# Patient Record
Sex: Female | Born: 1990 | Race: Black or African American | Hispanic: No | Marital: Single | Smoking: Never smoker
Health system: Southern US, Community
[De-identification: ages and names within clinical notes are randomized; demographics above are authoritative.]

## PROBLEM LIST (undated history)

## (undated) DIAGNOSIS — I10 Essential (primary) hypertension: Secondary | ICD-10-CM

## (undated) DIAGNOSIS — Z789 Other specified health status: Secondary | ICD-10-CM

## (undated) DIAGNOSIS — N83209 Unspecified ovarian cyst, unspecified side: Secondary | ICD-10-CM

## (undated) HISTORY — PX: OVARIAN CYST REMOVAL: SHX89

---

## 2013-12-07 ENCOUNTER — Encounter (HOSPITAL_COMMUNITY): Payer: Self-pay | Admitting: *Deleted

## 2013-12-07 ENCOUNTER — Emergency Department (HOSPITAL_COMMUNITY)
Admission: EM | Admit: 2013-12-07 | Discharge: 2013-12-08 | Disposition: A | Discharge status: Home / Self Care | Payer: Medicaid Other | Attending: Emergency Medicine | Admitting: Emergency Medicine

## 2013-12-07 ENCOUNTER — Emergency Department (HOSPITAL_COMMUNITY): Payer: Medicaid Other

## 2013-12-07 DIAGNOSIS — O26899 Other specified pregnancy related conditions, unspecified trimester: Secondary | ICD-10-CM

## 2013-12-07 DIAGNOSIS — S161XXA Strain of muscle, fascia and tendon at neck level, initial encounter: Secondary | ICD-10-CM

## 2013-12-07 DIAGNOSIS — O9A211 Injury, poisoning and certain other consequences of external causes complicating pregnancy, first trimester: Secondary | ICD-10-CM | POA: Diagnosis present

## 2013-12-07 DIAGNOSIS — Z3A14 14 weeks gestation of pregnancy: Secondary | ICD-10-CM | POA: Diagnosis not present

## 2013-12-07 DIAGNOSIS — Y92410 Unspecified street and highway as the place of occurrence of the external cause: Secondary | ICD-10-CM | POA: Insufficient documentation

## 2013-12-07 DIAGNOSIS — Y9389 Activity, other specified: Secondary | ICD-10-CM | POA: Diagnosis not present

## 2013-12-07 DIAGNOSIS — S3991XA Unspecified injury of abdomen, initial encounter: Secondary | ICD-10-CM | POA: Insufficient documentation

## 2013-12-07 DIAGNOSIS — R109 Unspecified abdominal pain: Secondary | ICD-10-CM

## 2013-12-07 LAB — CBC WITH DIFFERENTIAL/PLATELET
BASOS ABS: 0 10*3/uL (ref 0.0–0.1)
BASOS PCT: 0 % (ref 0–1)
EOS ABS: 0.1 10*3/uL (ref 0.0–0.7)
Eosinophils Relative: 1 % (ref 0–5)
HCT: 35.1 % — ABNORMAL LOW (ref 36.0–46.0)
HEMOGLOBIN: 11.4 g/dL — AB (ref 12.0–15.0)
Lymphocytes Relative: 25 % (ref 12–46)
Lymphs Abs: 1.7 10*3/uL (ref 0.7–4.0)
MCH: 28.1 pg (ref 26.0–34.0)
MCHC: 32.5 g/dL (ref 30.0–36.0)
MCV: 86.5 fL (ref 78.0–100.0)
MONO ABS: 0.4 10*3/uL (ref 0.1–1.0)
Monocytes Relative: 6 % (ref 3–12)
NEUTROS PCT: 68 % (ref 43–77)
Neutro Abs: 4.6 10*3/uL (ref 1.7–7.7)
Platelets: 257 10*3/uL (ref 150–400)
RBC: 4.06 MIL/uL (ref 3.87–5.11)
RDW: 13.5 % (ref 11.5–15.5)
WBC: 6.8 10*3/uL (ref 4.0–10.5)

## 2013-12-07 LAB — TYPE AND SCREEN
ABO/RH(D): O POS
Antibody Screen: NEGATIVE

## 2013-12-07 LAB — POC URINE PREG, ED: Preg Test, Ur: POSITIVE — AB
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
## 2013-12-07 NOTE — ED Notes (Signed)
Pt states that she has not noticed any more spotting when she went to the restroom while she was here.
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
## 2013-12-08 LAB — ABO/RH: ABO/RH(D): O POS
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
## 2014-01-25 ENCOUNTER — Other Ambulatory Visit (HOSPITAL_COMMUNITY): Payer: Self-pay | Admitting: Physician Assistant

## 2014-01-25 DIAGNOSIS — Z3689 Encounter for other specified antenatal screening: Secondary | ICD-10-CM

## 2014-01-25 LAB — OB RESULTS CONSOLE ABO/RH: RH Type: POSITIVE

## 2014-01-25 LAB — OB RESULTS CONSOLE HIV ANTIBODY (ROUTINE TESTING): HIV: NONREACTIVE

## 2014-01-25 LAB — OB RESULTS CONSOLE RUBELLA ANTIBODY, IGM: Rubella: NON-IMMUNE/NOT IMMUNE

## 2014-01-25 LAB — OB RESULTS CONSOLE RPR: RPR: NONREACTIVE

## 2014-01-25 LAB — OB RESULTS CONSOLE HEPATITIS B SURFACE ANTIGEN: Hepatitis B Surface Ag: NEGATIVE

## 2014-02-01 ENCOUNTER — Ambulatory Visit (HOSPITAL_COMMUNITY)
Admission: RE | Admit: 2014-02-01 | Discharge: 2014-02-01 | Disposition: A | Discharge status: Home / Self Care | Payer: Medicaid Other | Source: Ambulatory Visit | Attending: Physician Assistant | Admitting: Physician Assistant

## 2014-02-01 DIAGNOSIS — Z36 Encounter for antenatal screening of mother: Secondary | ICD-10-CM | POA: Insufficient documentation

## 2014-02-01 DIAGNOSIS — Z3A20 20 weeks gestation of pregnancy: Secondary | ICD-10-CM | POA: Insufficient documentation

## 2014-02-01 DIAGNOSIS — Z3689 Encounter for other specified antenatal screening: Secondary | ICD-10-CM

## 2014-02-02 DIAGNOSIS — Z3689 Encounter for other specified antenatal screening: Secondary | ICD-10-CM | POA: Insufficient documentation

## 2014-02-02 DIAGNOSIS — Z3A19 19 weeks gestation of pregnancy: Secondary | ICD-10-CM | POA: Insufficient documentation

## 2014-02-05 NOTE — L&D Delivery Note (Signed)
Patient is 24 y.o. G2P1001 5436w6d admitted IOL 2/2 gHTN   Delivery Note At 11:20 AM a viable female was delivered via SVD (Presentation: LOA).  APGAR: 8, 9; weight Pending.  Infant dried and placed on mother's abdomen.  Cord clamped and cut by Grandma.  Hospital cord blood sample collected.  Placenta delivered, fundal massage applied, vagina inspected for lacerations.  Placenta status:in tact .  Cord: 3 vessel with the following complications: none.  Zerita Boersarlene Lawson, CNM gloved and actively participated in the entirety of the delivery.  Anesthesia:  Epidural Episiotomy:  none Lacerations:  none Suture Repair: n/a Est. Blood Loss (mL):  150cc  Mom to postpartum.  Baby to Couplet care / Skin to Skin.  Delynn FlavinGottschalk, Ashly HuntingtonM, DO 06/06/2014, 11:32 AM  D Hart RochesterLawson CNM

## 2014-02-10 LAB — OB RESULTS CONSOLE GC/CHLAMYDIA
CHLAMYDIA, DNA PROBE: NEGATIVE
Gonorrhea: NEGATIVE

## 2014-04-07 ENCOUNTER — Encounter: Payer: Self-pay | Admitting: *Deleted

## 2014-05-29 LAB — OB RESULTS CONSOLE GBS: STREP GROUP B AG: POSITIVE

## 2014-06-05 ENCOUNTER — Encounter (HOSPITAL_COMMUNITY): Payer: Self-pay | Admitting: *Deleted

## 2014-06-05 ENCOUNTER — Inpatient Hospital Stay (HOSPITAL_COMMUNITY)
Admission: AD | Admit: 2014-06-05 | Discharge: 2014-06-08 | DRG: 775 | Disposition: A | Discharge status: Home / Self Care | Payer: Medicaid Other | Source: Ambulatory Visit | Attending: Obstetrics and Gynecology | Admitting: Obstetrics and Gynecology

## 2014-06-05 DIAGNOSIS — O99824 Streptococcus B carrier state complicating childbirth: Secondary | ICD-10-CM | POA: Diagnosis present

## 2014-06-05 DIAGNOSIS — Z3483 Encounter for supervision of other normal pregnancy, third trimester: Secondary | ICD-10-CM | POA: Diagnosis present

## 2014-06-05 DIAGNOSIS — Z3A37 37 weeks gestation of pregnancy: Secondary | ICD-10-CM | POA: Diagnosis present

## 2014-06-05 DIAGNOSIS — O133 Gestational [pregnancy-induced] hypertension without significant proteinuria, third trimester: Secondary | ICD-10-CM | POA: Diagnosis present

## 2014-06-05 DIAGNOSIS — O139 Gestational [pregnancy-induced] hypertension without significant proteinuria, unspecified trimester: Secondary | ICD-10-CM | POA: Diagnosis present

## 2014-06-05 HISTORY — DX: Other specified health status: Z78.9

## 2014-06-05 HISTORY — DX: Unspecified ovarian cyst, unspecified side: N83.209

## 2014-06-05 LAB — COMPREHENSIVE METABOLIC PANEL
ALBUMIN: 3 g/dL — AB (ref 3.5–5.2)
ALK PHOS: 111 U/L (ref 39–117)
ALT: 15 U/L (ref 0–35)
AST: 21 U/L (ref 0–37)
Anion gap: 9 (ref 5–15)
BUN: 12 mg/dL (ref 6–23)
CHLORIDE: 106 mmol/L (ref 96–112)
CO2: 23 mmol/L (ref 19–32)
CREATININE: 0.67 mg/dL (ref 0.50–1.10)
Calcium: 9.3 mg/dL (ref 8.4–10.5)
GFR calc Af Amer: >90 mL/min (ref >90)
GFR calc non Af Amer: >90 mL/min (ref >90)
Glucose, Bld: 100 mg/dL — ABNORMAL HIGH (ref 70–99)
POTASSIUM: 3.5 mmol/L (ref 3.5–5.1)
Sodium: 138 mmol/L (ref 135–145)
Total Bilirubin: 0.3 mg/dL (ref 0.3–1.2)
Total Protein: 6.7 g/dL (ref 6.0–8.3)

## 2014-06-05 LAB — CBC
HCT: 29.5 % — ABNORMAL LOW (ref 36.0–46.0)
HEMOGLOBIN: 9.4 g/dL — AB (ref 12.0–15.0)
MCH: 27.8 pg (ref 26.0–34.0)
MCHC: 31.9 g/dL (ref 30.0–36.0)
MCV: 87.3 fL (ref 78.0–100.0)
Platelets: 191 10*3/uL (ref 150–400)
RBC: 3.38 MIL/uL — ABNORMAL LOW (ref 3.87–5.11)
RDW: 14.4 % (ref 11.5–15.5)
WBC: 7.9 10*3/uL (ref 4.0–10.5)

## 2014-06-05 LAB — PROTEIN / CREATININE RATIO, URINE
Creatinine, Urine: 107 mg/dL
PROTEIN CREATININE RATIO: 0.2 mg/mg{creat} — AB (ref 0.00–0.15)
TOTAL PROTEIN, URINE: 21 mg/dL

## 2014-06-05 LAB — POCT FERN TEST: POCT FERN TEST: NEGATIVE

## 2014-06-05 MED ORDER — LACTATED RINGERS IV SOLN: 500.0000 mL | INTRAVENOUS | Status: DC | PRN

## 2014-06-05 MED ORDER — ONDANSETRON HCL 4 MG/2ML IJ SOLN: 4.0000 mg | Freq: Four times a day (QID) | INTRAMUSCULAR | Status: DC | PRN

## 2014-06-05 MED ORDER — ACETAMINOPHEN 325 MG PO TABS: 650.0000 mg | ORAL_TABLET | ORAL | Status: DC | PRN

## 2014-06-05 MED ORDER — OXYCODONE-ACETAMINOPHEN 5-325 MG PO TABS: 1.0000 | ORAL_TABLET | ORAL | Status: DC | PRN

## 2014-06-05 MED ORDER — LIDOCAINE HCL (PF) 1 % IJ SOLN
30.0000 mL | INTRAMUSCULAR | Status: DC | PRN
  Filled 2014-06-05: qty 30

## 2014-06-05 MED ORDER — TERBUTALINE SULFATE 1 MG/ML IJ SOLN: 0.2500 mg | Freq: Once | INTRAMUSCULAR | Status: AC | PRN

## 2014-06-05 MED ORDER — OXYTOCIN BOLUS FROM INFUSION
500.0000 mL | INTRAVENOUS | Status: DC
  Administered 2014-06-06: 500 mL via INTRAVENOUS

## 2014-06-05 MED ORDER — LACTATED RINGERS IV SOLN
INTRAVENOUS | Status: DC
  Administered 2014-06-06: 02:00:00 via INTRAVENOUS

## 2014-06-05 MED ORDER — CITRIC ACID-SODIUM CITRATE 334-500 MG/5ML PO SOLN: 30.0000 mL | ORAL | Status: DC | PRN

## 2014-06-05 MED ORDER — OXYCODONE-ACETAMINOPHEN 5-325 MG PO TABS: 2.0000 | ORAL_TABLET | ORAL | Status: DC | PRN

## 2014-06-05 MED ORDER — PENICILLIN G POTASSIUM 5000000 UNITS IJ SOLR
2.5000 10*6.[IU] | INTRAVENOUS | Status: DC
  Administered 2014-06-06 (×2): 2.5 10*6.[IU] via INTRAVENOUS
  Filled 2014-06-05 (×7): qty 2.5

## 2014-06-05 MED ORDER — PENICILLIN G POTASSIUM 5000000 UNITS IJ SOLR
5.0000 10*6.[IU] | Freq: Once | INTRAVENOUS | Status: AC
  Administered 2014-06-05: 5 10*6.[IU] via INTRAVENOUS
  Filled 2014-06-05: qty 5

## 2014-06-05 MED ORDER — OXYTOCIN 40 UNITS IN LACTATED RINGERS INFUSION - SIMPLE MED
62.5000 mL/h | INTRAVENOUS | Status: DC
  Administered 2014-06-06: 62.5 mL/h via INTRAVENOUS

## 2014-06-05 MED ORDER — OXYTOCIN 40 UNITS IN LACTATED RINGERS INFUSION - SIMPLE MED
1.0000 m[IU]/min | INTRAVENOUS | Status: DC
  Administered 2014-06-05: 2 m[IU]/min via INTRAVENOUS
  Administered 2014-06-06: 6 m[IU]/min via INTRAVENOUS
  Administered 2014-06-06: 4 m[IU]/min via INTRAVENOUS
  Administered 2014-06-06: 8 m[IU]/min via INTRAVENOUS
  Filled 2014-06-05: qty 1000

## 2014-06-05 NOTE — MAU Provider Note (Signed)
HPI  Note entered in error, please see H&P for further documentation.  Unable to delete this note.  ROSPhysical Exam  MAU Course  Procedures  Brandi Hatfield ROCIO 06/05/2014, 9:16 PM

## 2014-06-05 NOTE — MAU Note (Signed)
Pt reports she had some leaking around 5 pm tonight also c/o some pain in her lower back and side. Good fetal movement reported.

## 2014-06-05 NOTE — H&P (Signed)
LABOR ADMISSION HISTORY AND PHYSICAL  Brandi Hatfield is a 24 y.o. female G2P1001 with IUP at [redacted]w[redacted]d by LMP c/w 12wk sono presenting for evaluation of rupture of membranes, reports leaking since about 1700. She reports +FMs, no VB, no blurry vision, headaches or peripheral edema, and RUQ pain.  She plans on breast feeding. She request nexplanon for birth control, had signed BTL consents but has since changed her mind  Dating: By LMP c/w 12w sono --->  Estimated Date of Delivery: 06/22/14   Prenatal History/Complications:  Past Medical History: Past Medical History  Diagnosis Date  . Medical history non-contributory   . Ovarian cyst     Past Surgical History: Past Surgical History  Procedure Laterality Date  . Ovarian cyst removal      Obstetrical History: OB History    Gravida Para Term Preterm AB TAB SAB Ectopic Multiple Living   Social History: History   Social History  . Marital Status: Single    Spouse Name: N/A  . Number of Children: N/A  . Years of Education: N/A   Social History Main Topics  . Smoking status: Never Smoker   . Smokeless tobacco: Not on file  . Alcohol Use: No  . Drug Use: No  . Sexual Activity: Not on file   Other Topics Concern  . None   Social History Narrative    Family History: No family history on file.  Allergies: No Known Allergies  Prescriptions prior to admission  Medication Sig Dispense Refill Last Dose  . Prenatal Vit-Fe Fumarate-FA (PRENATAL MULTIVITAMIN) TABS tablet Take 1 tablet by mouth daily at 12 noon.   Past Week at Unknown time     Review of Systems   All systems reviewed and negative except as stated in HPI  Blood pressure 134/85, pulse 93, temperature 98.9 F (37.2 C), resp. rate 18, last menstrual period 09/21/2013. General appearance: alert, cooperative and appears stated age Lungs: clear to auscultation bilaterally Heart: regular rate and rhythm Abdomen: soft, non-tender; bowel  sounds normal Pelvic: adequate Extremities: Homans sign is negative, no sign of DVT DTR's 2+ Presentation: cephalic Fetal monitoringBaseline: 140 bpm, Variability: Good {> 6 bpm), Accelerations: Reactive and Decelerations: Absent Uterine activityirregular  SSE: negative valsalva/pooling, white vaginal discharge noted. Dilation: 2.5 Effacement (%): 50 Exam by:: Dr. Loreta Ave   Prenatal labs: ABO, Rh: O/Positive/-- (12/21 0000) Antibody: NEG (11/02 2249) Rubella:   RPR: Nonreactive (12/21 0000)  HBsAg: Negative (12/21 0000)  HIV: Non-reactive (12/21 0000)  GBS: Positive (04/23 0000)  1 hr Glucola 91; 141 => neg 3h Genetic screening  Negative Quad Anatomy US normal  Prenatal Transfer Tool  Maternal Diabetes: No Genetic Screening: Normal Maternal Ultrasounds/Referrals: Normal Fetal Ultrasounds or other Referrals:  None Maternal Substance Abuse:  No Significant Maternal Medications:  None Significant Maternal Lab Results: Lab values include: Group B Strep positive  Results for orders placed or performed during the hospital encounter of 06/05/14 (from the past 24 hour(s))  CBC   Collection Time: 06/05/14  9:20 PM  Result Value Ref Range   WBC 7.9 4.0 - 10.5 K/uL   RBC 3.38 (L) 3.87 - 5.11 MIL/uL   Hemoglobin 9.4 (L) 12.0 - 15.0 g/dL   HCT 91.4 (L) 78.2 - 95.6 %   MCV 87.3 78.0 - 100.0 fL   MCH 27.8 26.0 - 34.0 pg   MCHC 31.9 30.0 - 36.0 g/dL   RDW 21.3  11.5 - 15.5 %   Platelets 191 150 - 400 K/uL    Patient Active Problem List   Diagnosis Date Noted  . [redacted] weeks gestation of pregnancy   . Encounter for fetal anatomic survey     Assessment: Karalee HeightDanita Dewoody is a 24 y.o. G2P1001 at 4758w4d here for labor evaluation admitted for IOL 2/2 gHTN  #Labor: pitocin, favorable cervix on exam, no signs of ROM #Pain: Epidural upon request #FWB: Cat I #ID:  GBS+ => PCN #MOF: breast #MOC:nexplanon, declines BTL #gHTN: meets criteria based on elevated blood pressures today and BP  131/92 at 28w.  preE labs.  Esther Broyles ROCIO 06/05/2014, 9:46 PM

## 2014-06-06 ENCOUNTER — Inpatient Hospital Stay (HOSPITAL_COMMUNITY): Payer: Medicaid Other | Admitting: Anesthesiology

## 2014-06-06 ENCOUNTER — Encounter (HOSPITAL_COMMUNITY): Payer: Self-pay | Admitting: Anesthesiology

## 2014-06-06 DIAGNOSIS — O133 Gestational [pregnancy-induced] hypertension without significant proteinuria, third trimester: Secondary | ICD-10-CM

## 2014-06-06 DIAGNOSIS — Z3A37 37 weeks gestation of pregnancy: Secondary | ICD-10-CM

## 2014-06-06 DIAGNOSIS — O99824 Streptococcus B carrier state complicating childbirth: Secondary | ICD-10-CM

## 2014-06-06 LAB — CBC
HCT: 29.9 % — ABNORMAL LOW (ref 36.0–46.0)
Hemoglobin: 9.4 g/dL — ABNORMAL LOW (ref 12.0–15.0)
MCH: 26.9 pg (ref 26.0–34.0)
MCHC: 31.4 g/dL (ref 30.0–36.0)
MCV: 85.7 fL (ref 78.0–100.0)
Platelets: 194 10*3/uL (ref 150–400)
RBC: 3.49 MIL/uL — ABNORMAL LOW (ref 3.87–5.11)
RDW: 14.3 % (ref 11.5–15.5)
WBC: 10.1 10*3/uL (ref 4.0–10.5)

## 2014-06-06 LAB — HIV ANTIBODY (ROUTINE TESTING W REFLEX): HIV Screen 4th Generation wRfx: NONREACTIVE

## 2014-06-06 LAB — RPR: RPR Ser Ql: NONREACTIVE

## 2014-06-06 MED ORDER — OXYCODONE-ACETAMINOPHEN 5-325 MG PO TABS: 2.0000 | ORAL_TABLET | ORAL | Status: DC | PRN

## 2014-06-06 MED ORDER — BENZOCAINE-MENTHOL 20-0.5 % EX AERO
1.0000 "application " | INHALATION_SPRAY | CUTANEOUS | Status: DC | PRN
  Administered 2014-06-06: 1 via TOPICAL
  Filled 2014-06-06: qty 56

## 2014-06-06 MED ORDER — WITCH HAZEL-GLYCERIN EX PADS: 1.0000 "application " | MEDICATED_PAD | CUTANEOUS | Status: DC | PRN

## 2014-06-06 MED ORDER — FENTANYL 2.5 MCG/ML BUPIVACAINE 1/10 % EPIDURAL INFUSION (WH - ANES)
14.0000 mL/h | INTRAMUSCULAR | Status: DC | PRN
  Administered 2014-06-06: 14 mL/h via EPIDURAL
  Filled 2014-06-06: qty 125

## 2014-06-06 MED ORDER — LANOLIN HYDROUS EX OINT: TOPICAL_OINTMENT | CUTANEOUS | Status: DC | PRN

## 2014-06-06 MED ORDER — DIBUCAINE 1 % RE OINT: 1.0000 "application " | TOPICAL_OINTMENT | RECTAL | Status: DC | PRN

## 2014-06-06 MED ORDER — OXYCODONE-ACETAMINOPHEN 5-325 MG PO TABS
1.0000 | ORAL_TABLET | ORAL | Status: DC | PRN
Start: 2014-06-06 — End: 2014-06-08
  Administered 2014-06-06 – 2014-06-07 (×2): 1 via ORAL
  Filled 2014-06-06 (×2): qty 1

## 2014-06-06 MED ORDER — EPHEDRINE 5 MG/ML INJ
10.0000 mg | INTRAVENOUS | Status: DC | PRN
  Filled 2014-06-06: qty 2

## 2014-06-06 MED ORDER — IBUPROFEN 600 MG PO TABS
600.0000 mg | ORAL_TABLET | Freq: Four times a day (QID) | ORAL | Status: DC
  Administered 2014-06-06 – 2014-06-08 (×9): 600 mg via ORAL
  Filled 2014-06-06 (×9): qty 1

## 2014-06-06 MED ORDER — HYDRALAZINE HCL 20 MG/ML IJ SOLN: 10.0000 mg | Freq: Once | INTRAMUSCULAR | Status: AC | PRN

## 2014-06-06 MED ORDER — PRENATAL MULTIVITAMIN CH
1.0000 | ORAL_TABLET | Freq: Every day | ORAL | Status: DC
  Administered 2014-06-07 – 2014-06-08 (×2): 1 via ORAL
  Filled 2014-06-06 (×2): qty 1

## 2014-06-06 MED ORDER — SIMETHICONE 80 MG PO CHEW: 80.0000 mg | CHEWABLE_TABLET | ORAL | Status: DC | PRN

## 2014-06-06 MED ORDER — ACETAMINOPHEN 325 MG PO TABS: 650.0000 mg | ORAL_TABLET | ORAL | Status: DC | PRN

## 2014-06-06 MED ORDER — LIDOCAINE HCL (PF) 1 % IJ SOLN
INTRAMUSCULAR | Status: DC | PRN
  Administered 2014-06-06 (×2): 8 mL

## 2014-06-06 MED ORDER — ZOLPIDEM TARTRATE 5 MG PO TABS: 5.0000 mg | ORAL_TABLET | Freq: Every evening | ORAL | Status: DC | PRN

## 2014-06-06 MED ORDER — TETANUS-DIPHTH-ACELL PERTUSSIS 5-2.5-18.5 LF-MCG/0.5 IM SUSP
0.5000 mL | Freq: Once | INTRAMUSCULAR | Status: AC
  Administered 2014-06-07: 0.5 mL via INTRAMUSCULAR

## 2014-06-06 MED ORDER — SENNOSIDES-DOCUSATE SODIUM 8.6-50 MG PO TABS
2.0000 | ORAL_TABLET | ORAL | Status: DC
  Administered 2014-06-07 (×2): 2 via ORAL
  Filled 2014-06-06 (×2): qty 2

## 2014-06-06 MED ORDER — FENTANYL CITRATE (PF) 100 MCG/2ML IJ SOLN
100.0000 ug | INTRAMUSCULAR | Status: DC | PRN
  Administered 2014-06-06: 100 ug via INTRAVENOUS
  Filled 2014-06-06: qty 2

## 2014-06-06 MED ORDER — ONDANSETRON HCL 4 MG/2ML IJ SOLN: 4.0000 mg | INTRAMUSCULAR | Status: DC | PRN

## 2014-06-06 MED ORDER — DIPHENHYDRAMINE HCL 50 MG/ML IJ SOLN: 12.5000 mg | INTRAMUSCULAR | Status: DC | PRN

## 2014-06-06 MED ORDER — PHENYLEPHRINE 40 MCG/ML (10ML) SYRINGE FOR IV PUSH (FOR BLOOD PRESSURE SUPPORT)
80.0000 ug | PREFILLED_SYRINGE | INTRAVENOUS | Status: DC | PRN
  Filled 2014-06-06: qty 2
  Filled 2014-06-06: qty 20

## 2014-06-06 MED ORDER — DIPHENHYDRAMINE HCL 25 MG PO CAPS: 25.0000 mg | ORAL_CAPSULE | Freq: Four times a day (QID) | ORAL | Status: DC | PRN

## 2014-06-06 MED ORDER — ONDANSETRON HCL 4 MG PO TABS: 4.0000 mg | ORAL_TABLET | ORAL | Status: DC | PRN

## 2014-06-06 NOTE — Progress Notes (Signed)
**  Interval Note**  RN called to inform of elevated BPs to 160's systolic.  Patient asx.  Most recent BP 150's systolic.  Discussed with Zerita Boersarlene Lawson, CNM.  Will continue to monitor BP.  If elevation again to 160's, will administer Hydralazine 10mg  IM x1 and reassess.  Ashly M. Nadine CountsGottschalk, DO PGY-1, [facility]

## 2014-06-06 NOTE — Anesthesia Preprocedure Evaluation (Signed)

## 2014-06-06 NOTE — Progress Notes (Signed)
Brandi HeightDanita Hatfield is a 24 y.o. G2P1001 at 4234w6d by LMP admitted for induction of labor due to Hypertension.  Subjective: Patient reports that she is feeling well.  Pain well controlled at this time.  Objective: BP 133/82 mmHg  Pulse 97  Temp(Src) 98.8 F (37.1 C) (Oral)  Resp 20  Ht 5\' 2"  (1.575 m)  Wt 147 lb (66.679 kg)  BMI 26.88 kg/m2  SpO2 100%  LMP 09/21/2013     FHT:  FHR: 145 bpm, variability: minimal ,  accelerations:  Abscent,  decelerations:  Absent UC:   regular, every 2 minutes SVE:   Dilation: 8 Effacement (%): 100 Station: -2 Exam by:: s grindstaff rn   Labs: Lab Results  Component Value Date   WBC 10.1 06/06/2014   HGB 9.4* 06/06/2014   HCT 29.9* 06/06/2014   MCV 85.7 06/06/2014   PLT 194 06/06/2014   Assessment / Plan: Induction of labor due to gestational hypertension,  progressing well on pitocin  Labor: Progressing on Pitocin, will continue to increase then AROM Fetal Wellbeing:  Category I Pain Control:  Epidural I/D:  GBS+, PCN Anticipated MOD:  NSVD  Raliegh IpGottschalk, Indie Nickerson M, DO 06/06/2014, 9:28 AM

## 2014-06-06 NOTE — Anesthesia Procedure Notes (Signed)
Epidural Patient location during procedure: OB Start time: 06/06/2014 6:40 AM End time: 06/06/2014 6:44 AM  Staffing Anesthesiologist: Leilani AbleHATCHETT, Aniya Jolicoeur Performed by: anesthesiologist   Preanesthetic Checklist Completed: patient identified, surgical consent, pre-op evaluation, timeout performed, IV checked, risks and benefits discussed and monitors and equipment checked  Epidural Patient position: sitting Prep: site prepped and draped and DuraPrep Patient monitoring: continuous pulse ox and blood pressure Approach: midline Location: L3-L4 Injection technique: LOR air  Needle:  Needle type: Tuohy  Needle gauge: 17 G Needle length: 9 cm and 9 Needle insertion depth: 5 cm cm Catheter type: closed end flexible Catheter size: 19 Gauge Catheter at skin depth: 10 cm Test dose: negative and Other  Assessment Sensory level: T9 Events: blood not aspirated, injection not painful, no injection resistance, negative IV test and no paresthesia

## 2014-06-06 NOTE — Lactation Note (Signed)
This note was copied from the chart of Brandi Karalee HeightDanita Brine. Lactation Consultation Note Initial visit at 11 hours of age.  Mom reports baby is doing well and she just wants to offer bottles with formula when she is tired.  Mom is ready to try feeding baby at the breast now.  Baby sleepy and dressed.  Offered to undress and mom declines.  Attempted waking techniques, but baby remains sleepy.  Mom is able to demonstrate hand expression with colostrum visible.  [facility] resources given and discussed.  Encouraged to feed with early cues on demand.  Early newborn behavior discussed.  Mom to call for assist as needed.    Patient Name: Brandi Hatfield Reason for consult: Initial assessment   Maternal Data Has patient been taught Hand Expression?: Yes Does the patient have breastfeeding experience prior to this delivery?: Yes  Feeding Feeding Type: Breast Fed  LATCH Score/Interventions                      Lactation Tools Discussed/Used WIC Program: Yes   Consult Status Consult Status: Follow-up Date: 06/07/14 Follow-up type: In-patient    Brandi Hatfield, Brandi Hatfield Hatfield, 10:36 PM

## 2014-06-06 NOTE — Progress Notes (Signed)
No orders left

## 2014-06-07 MED ORDER — MEASLES, MUMPS & RUBELLA VAC ~~LOC~~ INJ
0.5000 mL | INJECTION | Freq: Once | SUBCUTANEOUS | Status: AC
  Administered 2014-06-08: 0.5 mL via SUBCUTANEOUS
  Filled 2014-06-07 (×2): qty 0.5

## 2014-06-07 NOTE — Anesthesia Postprocedure Evaluation (Signed)
  Anesthesia Post-op Note  Patient: Brandi HeightDanita Hatfield  Procedure(s) Performed: * No procedures listed *  Patient Location: Mother/Baby  Anesthesia Type:Epidural  Level of Consciousness: awake, alert , oriented and patient cooperative  Airway and Oxygen Therapy: Patient Spontanous Breathing  Post-op Pain: none  Post-op Assessment: Post-op Vital signs reviewed, Patient's Cardiovascular Status Stable, Respiratory Function Stable, Patent Airway, No headache, No backache, No residual numbness and No residual motor weakness  Post-op Vital Signs: Reviewed and stable  Last Vitals:  Filed Vitals:   06/07/14 0601  BP: 124/74  Pulse: 63  Temp: 36.7 C  Resp: 18    Complications: No apparent anesthesia complications

## 2014-06-07 NOTE — Progress Notes (Signed)
Post Partum Day 1 Subjective: no complaints, up ad lib, voiding and tolerating PO  Objective: Blood pressure 124/74, pulse 63, temperature 98.1 F (36.7 C), temperature source Oral, resp. rate 18, height 5\' 2"  (1.575 m), weight 147 lb (66.679 kg), last menstrual period 09/21/2013, SpO2 100 %, unknown if currently breastfeeding.  Physical Exam:  General: alert, cooperative, appears stated age and no distress Lochia: appropriate Uterine Fundus: firm Incision: n/a DVT Evaluation: No evidence of DVT seen on physical exam. Negative Homan's sign. No cords or calf tenderness.   Recent Labs  06/05/14 2120 06/06/14 0546  HGB 9.4* 9.4*  HCT 29.5* 29.9*    Assessment/Plan: Plan for discharge tomorrow   LOS: 2 days   Wyvonnia DuskyLAWSON, Nithya Meriweather DARLENE 06/07/2014, 6:52 AM

## 2014-06-07 NOTE — Progress Notes (Addendum)
CSW acknowledges consult for history of depression. CSW attempted to meet with MOB, but MOB was leeping.   CSW will make second attempt later in the afternoon.  Update at 2:15pm: CSW introduced self to MOB.  MOB had family member in her room.  CSW offered to return when MOB did not have visitors, MOB reported preference for CSW to return at a later time. CSW to follow up on 5/3.  Loleta BooksSarah Larin Weissberg, LCSW [phone]

## 2014-06-07 NOTE — Lactation Note (Addendum)
This note was copied from the chart of Brandi Karalee HeightDanita Trupiano. Lactation Consultation Note  Patient Name: Brandi Hatfield UJWJX'BToday's Date: 06/07/2014 Reason for consult: Follow-up assessment (per mom I'm breast and bottle feeding )  Per mom I recently bottle fed , and the last time I latched the baby was this morning.  Per mom breast are feeling fuller. LC recommended breast feeding 1st , both breast Especially now that her breast are filling. If the baby isn't latching , may have to give  An appetizer from a bottle and then latch. Feed skin to skin so baby will stay awake  For the feeding. Mom denies sore ness.       Maternal Data    Feeding Feeding Type: Bottle Fed - Formula Nipple Type: Slow - flow  LATCH Score/Interventions                Intervention(s): Breastfeeding basics reviewed     Lactation Tools Discussed/Used     Consult Status Consult Status: PRN Date: 06/08/14 Follow-up type: In-patient    [name], [name] 06/07/2014, 4:28 PM

## 2014-06-07 NOTE — Addendum Note (Signed)
Addendum  created 06/07/14 0746 by Yolonda KidaAlison L Ranjit Ashurst, CRNA   Modules edited: Notes Section   Notes Section:  File: [id number]

## 2014-06-07 NOTE — Progress Notes (Signed)
UR chart review completed.  

## 2014-06-08 ENCOUNTER — Encounter (HOSPITAL_COMMUNITY): Payer: Self-pay | Admitting: *Deleted

## 2014-06-08 MED ORDER — IBUPROFEN 600 MG PO TABS
600.0000 mg | ORAL_TABLET | Freq: Four times a day (QID) | ORAL | Status: AC
Start: 2014-06-08 — End: ?

## 2014-06-08 MED ORDER — WITCH HAZEL-GLYCERIN EX PADS: 1.0000 "application " | MEDICATED_PAD | CUTANEOUS | Status: AC | PRN

## 2014-06-08 MED ORDER — DIBUCAINE 1 % RE OINT: 1.0000 "application " | TOPICAL_OINTMENT | RECTAL | Status: AC | PRN

## 2014-06-08 MED ORDER — OXYCODONE-ACETAMINOPHEN 5-325 MG PO TABS: 1.0000 | ORAL_TABLET | ORAL | Status: DC | PRN

## 2014-06-08 MED ORDER — OXYCODONE-ACETAMINOPHEN 5-325 MG PO TABS: 1.0000 | ORAL_TABLET | ORAL | Status: AC | PRN

## 2014-06-08 NOTE — Discharge Summary (Signed)
Obstetric Discharge Summary Reason for Admission: induction of labor Prenatal Procedures: ultrasound Intrapartum Procedures: spontaneous vaginal delivery and GBS prophylaxis Postpartum Procedures: TDap Complications-Operative and Postpartum: none  Patient is 24 y.o. G2P1001 6937w6d admitted IOL 2/2 gHTN  Delivery Note At 11:20 AM a viable female was delivered via SVD (Presentation: LOA). APGAR: 8, 9; weight Pending. Infant dried and placed on mother's abdomen. Cord clamped and cut by Grandma. Hospital cord blood sample collected. Placenta delivered, fundal massage applied, vagina inspected for lacerations. Placenta status:in tact . Cord: 3 vessel with the following complications: none. Brandi Hatfield, CNM gloved and actively participated in the entirety of the delivery.  Anesthesia: Epidural Episiotomy: none Lacerations: none Suture Repair: n/a Est. Blood Loss (mL): 150cc  Mom to postpartum. Baby to Couplet care / Skin to Skin.  Hospital Course:  Active Problems:   Gestational hypertension   SVD (spontaneous vaginal delivery)   Brandi Hatfield is a 24 y.o. G2P2000 s/p SVD.  Patient was admitted IOL 2/2 gHTN.  She had a postpartum course that was uncomplicated including no problems with ambulating, PO intake, urination, pain, or bleeding. The patient feels ready to go home and will be discharged with outpatient follow-up. Denies s/s preeclampsia: no H/A, visual disturbances, or RUQ pain.  Today: No acute events overnight.  Pt denies problems with ambulating, voiding or po intake.  She denies nausea or vomiting.  Pain is well controlled.  She has had flatus. She has not had bowel movement.  Lochia Small.  Plan for birth control is Nexplanon.  Method of Feeding: Breast  Physical Exam:  Filed Vitals:   06/07/14 0245 06/07/14 0601 06/07/14 1730 06/08/14 0557  BP: 134/86 124/74 117/79 149/87  Pulse: 96 63 98 69  Temp: 98.2 F (36.8 C) 98.1 F (36.7 C) 99.1 F (37.3 C) 98.6  F (37 C)  TempSrc: Oral Oral Oral Oral  Resp: 18 18 18 18   Height:      Weight:      SpO2: 100% 100%  100%   General: alert, cooperative, appears stated age and no distress  Cardio: RRR, no murmurs Pulm: CTAB, no increased WOB Lochia: appropriate Uterine Fundus: firm Ext: WWP, no edema DVT Evaluation: No evidence of DVT seen on physical exam. Negative Homan's sign. No cords or calf tenderness.  H/H: Lab Results  Component Value Date/Time   HGB 9.4* 06/06/2014 05:46 AM   HCT 29.9* 06/06/2014 05:46 AM    Discharge Diagnoses: Term Pregnancy-delivered  Discharge Information: Date: 06/08/2014 Activity: pelvic rest Diet: routine  Medications: PNV, Ibuprofen and Percocet Breast feeding:  Yes Condition: stable Instructions: refer to handout Discharge to: home      Discharge Instructions    Baby Love Nurse Visit    Complete by:  As directed   Pleased check BPs 3 and 7 days after discharge            Medication List    TAKE these medications        dibucaine 1 % Oint  Commonly known as:  NUPERCAINAL  Place 1 application rectally as needed for hemorrhoids.     ibuprofen 600 MG tablet  Commonly known as:  ADVIL,MOTRIN  Take 1 tablet (600 mg total) by mouth every 6 (six) hours.     oxyCODONE-acetaminophen 5-325 MG per tablet  Commonly known as:  PERCOCET/ROXICET  Take 1 tablet by mouth every 4 (four) hours as needed (for pain scale 4-7).     prenatal multivitamin Tabs tablet  Take 1 tablet by  mouth daily at 12 noon.     witch hazel-glycerin pad  Commonly known as:  TUCKS  Apply 1 application topically as needed for hemorrhoids.       Follow-up Information    Follow up with [facility] [place]. Schedule an appointment as soon as possible for a visit in 6 weeks.   Why:  postpartum   Contact information:   [address] [place] [address] [phone]      Raliegh Ip, DO [facility], PGY-1 06/08/2014,7:43 AM    CNM attestation I have seen and examined this patient and agree with above documentation in the resident's note.   Brandi Hatfield is a 24 y.o. G2P2000 s/p SVD.   Pain is well controlled.  Plan for birth control is Nexplanon.  Method of Feeding: breast  PE:  BP 149/87 mmHg  Pulse 69  Temp(Src) 98.6 F (37 C) (Oral)  Resp 18  Ht  (1.575 m)  Wt 66.679 kg (147 lb)  BMI 26.88 kg/m2  SpO2 100%  LMP 09/21/2013  Breastfeeding? Unknown Fundus firm   Recent Labs  06/05/14 2120 06/06/14 0546  HGB 9.4* 9.4*  HCT 29.5* 29.9*     Plan: discharge today - postpartum care discussed - f/u clinic in 6 weeks for postpartum visit, and Baby Love BP check in one week   Cam Hai, CNM 8:39 AM

## 2014-06-08 NOTE — Discharge Instructions (Signed)
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
## 2014-06-08 NOTE — Lactation Note (Signed)
This note was copied from the chart of Boy Karalee HeightDanita Laprade. Lactation Consultation Note  Patient Name: Boy Karalee HeightDanita Vanallen ZOXWR'UToday's Date: 06/08/2014 Reason for consult: Follow-up assessment (D/C BF review )  Baby is 48 hours old and per mom baby fed form the bottle most of the night , he seemed to prefer the bottles. LC reviewed supply and demand. LC recommended breast feeding 1st both breast and then supplementing not more than 30 ml. As her breast are getting full, to supplement prn , or keep it low.  Sore and engorgement prevention and tx reviewed , referring to the Baby and me booklet pages 24 -25. LC instructed mom on the use hand pump.  Mother informed of post-discharge support and given phone number to the lactation department, including services for phone call  assistance; out-patient appointments; and breastfeeding support group. List of other breastfeeding resources in the community given  in the handout. Encouraged mother to call for problems or concerns related to breastfeeding.   Maternal Data    Feeding    LATCH Score/Interventions                Intervention(s): Breastfeeding basics reviewed (see LC note )     Lactation Tools Discussed/Used Tools: Pump Breast pump type: Manual WIC Program: Yes Pump Review: Setup, frequency, and cleaning;Milk Storage Initiated by:: MAI  Date initiated:: 06/08/14   Consult Status Consult Status: Complete Date: 06/08/14    [name], Lynann Demetrius Ann 06/08/2014, 11:40 AM

## 2015-01-06 ENCOUNTER — Encounter (HOSPITAL_COMMUNITY): Payer: Self-pay

## 2015-01-06 ENCOUNTER — Emergency Department (HOSPITAL_COMMUNITY)
Admission: EM | Admit: 2015-01-06 | Discharge: 2015-01-07 | Disposition: A | Discharge status: Home / Self Care | Payer: Medicaid Other | Attending: Emergency Medicine | Admitting: Emergency Medicine

## 2015-01-06 DIAGNOSIS — Z3202 Encounter for pregnancy test, result negative: Secondary | ICD-10-CM | POA: Insufficient documentation

## 2015-01-06 DIAGNOSIS — N390 Urinary tract infection, site not specified: Secondary | ICD-10-CM

## 2015-01-06 DIAGNOSIS — J020 Streptococcal pharyngitis: Secondary | ICD-10-CM | POA: Insufficient documentation

## 2015-01-06 HISTORY — DX: Essential (primary) hypertension: I10

## 2015-01-06 LAB — URINALYSIS, ROUTINE W REFLEX MICROSCOPIC
BILIRUBIN URINE: NEGATIVE
GLUCOSE, UA: NEGATIVE mg/dL
Ketones, ur: NEGATIVE mg/dL
Nitrite: NEGATIVE
PH: 6 (ref 5.0–8.0)
Protein, ur: NEGATIVE mg/dL
Specific Gravity, Urine: 1.024 (ref 1.005–1.030)

## 2015-01-06 LAB — URINE MICROSCOPIC-ADD ON

## 2015-01-06 LAB — POC URINE PREG, ED: Preg Test, Ur: NEGATIVE

## 2015-01-06 LAB — RAPID STREP SCREEN (MED CTR MEBANE ONLY): Streptococcus, Group A Screen (Direct): POSITIVE — AB

## 2015-01-06 MED ORDER — PENICILLIN G BENZATHINE 1200000 UNIT/2ML IM SUSP
1.2000 10*6.[IU] | Freq: Once | INTRAMUSCULAR | Status: AC
  Administered 2015-01-07: 1.2 10*6.[IU] via INTRAMUSCULAR
  Filled 2015-01-06: qty 2

## 2015-01-06 MED ORDER — SULFAMETHOXAZOLE-TRIMETHOPRIM 800-160 MG PO TABS
1.0000 | ORAL_TABLET | Freq: Once | ORAL | Status: AC
  Administered 2015-01-07: 1 via ORAL
  Filled 2015-01-06: qty 1

## 2015-01-06 MED ORDER — PHENAZOPYRIDINE HCL 100 MG PO TABS
100.0000 mg | ORAL_TABLET | Freq: Three times a day (TID) | ORAL | Status: DC
  Administered 2015-01-07: 100 mg via ORAL
  Filled 2015-01-06: qty 1

## 2015-01-06 NOTE — ED Notes (Signed)
Bed: WTR6 Expected date:  Expected time:  Means of arrival:  Comments: Blood draws

## 2015-01-06 NOTE — ED Notes (Signed)
PT C/O SORE THROAT SINCE Monday. DENIES COUGH. PT ALSO C/O URINARY FREQUENCY AND PRESSURE SINCE Monday. DENIES FEVER AT HOME.

## 2015-01-06 NOTE — ED Provider Notes (Signed)
CSN: [id number]     Arrival date & time 01/06/15  2159 History  By signing my name below, I, Soijett Blue, attest that this documentation has been prepared under the direction and in the presence of Earley FavorGail Dorothye Berni, NP Electronically Signed: Soijett Blue, ED Scribe. 01/06/2015. 10:57 PM.   Chief Complaint  Patient presents with  . Sore Throat    SINCE MONDAY  . Urinary Frequency    SINCE MONDAY      The history is provided by the patient. No language interpreter was used.    Brandi Hatfield is a 24 y.o. female who presents to the Emergency Department complaining of sore throat onset 4 days. She states that she is having associated symptoms of HA. She notes that she gets HA all the time which she states "I just deal with them." She has not tried any OTC medications for the relief for her symptoms. She denies cough, fever, and any other symptoms. She notes that her 557 month old son is currently sick.   She secondarily complains of urinary frequency x 5 days. She states "I know it's a UTI because I get them all the time." She reports that she normally comes to the ED for them and that she doesn't have a PCP at this time. She denies any other symptoms.  Patient's last menstrual period was 01/03/2015.   Past Medical History  Diagnosis Date  . Medical history non-contributory   . Ovarian cyst   . Hypertension     DURING PREGNANCY   Past Surgical History  Procedure Laterality Date  . Ovarian cyst removal      RIGHT   History reviewed. No pertinent family history. Social History  Substance Use Topics  . Smoking status: Never Smoker   . Smokeless tobacco: None  . Alcohol Use: No   OB History    Gravida Para Term Preterm AB TAB SAB Ectopic Multiple Living   2 2 2       0 1     Review of Systems  Constitutional: Negative for fever.  HENT: Positive for sore throat. Negative for trouble swallowing.   Respiratory: Negative for cough.   Genitourinary: Positive for frequency.       Allergies  Review of patient's allergies indicates no known allergies.  Home Medications   Prior to Admission medications   Medication Sig Start Date End Date Taking? Authorizing Provider  dibucaine (NUPERCAINAL) 1 % OINT Place 1 application rectally as needed for hemorrhoids. Patient not taking: Reported on 01/06/2015 06/08/14   Raliegh IpAshly M Gottschalk, DO  ibuprofen (ADVIL,MOTRIN) 600 MG tablet Take 1 tablet (600 mg total) by mouth every 6 (six) hours. Patient not taking: Reported on 01/06/2015 06/08/14   Raliegh IpAshly M Gottschalk, DO  oxyCODONE-acetaminophen (PERCOCET/ROXICET) 5-325 MG per tablet Take 1 tablet by mouth every 4 (four) hours as needed (for pain scale 4-7). Patient not taking: Reported on 01/06/2015 06/08/14   Raliegh IpAshly M Gottschalk, DO  phenazopyridine (PYRIDIUM) 200 MG tablet Take 1 tablet (200 mg total) by mouth 3 (three) times daily. 01/07/15   Earley FavorGail Jeyren Danowski, NP  sulfamethoxazole-trimethoprim (BACTRIM DS,SEPTRA DS) 800-160 MG tablet Take 1 tablet by mouth 2 (two) times daily. 01/07/15 01/14/15  Earley FavorGail Jamiah Recore, NP  witch hazel-glycerin (TUCKS) pad Apply 1 application topically as needed for hemorrhoids. Patient not taking: Reported on 01/06/2015 06/08/14   Ashly M Gottschalk, DO   BP 113/74 mmHg  Pulse 65  Temp(Src) 99.3 F (37.4 C) (Oral)  Resp 16  Ht 5\' 2"  (1.575  m)  Wt 51.71 kg  BMI 20.85 kg/m2  SpO2 98%  LMP 01/03/2015  Breastfeeding? No Physical Exam  Constitutional: She is oriented to person, place, and time. She appears well-developed and well-nourished. No distress.  HENT:  Head: Normocephalic and atraumatic.  Mouth/Throat: Posterior oropharyngeal erythema present. No oropharyngeal exudate or posterior oropharyngeal edema.  Eyes: EOM are normal.  Neck: Neck supple.  Cardiovascular: Normal rate.   Pulmonary/Chest: Effort normal.  Abdominal: Soft. She exhibits no distension. There is no tenderness.  Musculoskeletal: Normal range of motion.  Lymphadenopathy:    She has  cervical adenopathy.  Neurological: She is alert and oriented to person, place, and time.  Skin: Skin is warm and dry. She is not diaphoretic.  Psychiatric: She has a normal mood and affect. Her behavior is normal.  Nursing note and vitals reviewed.   ED Course  Procedures (including critical care time) DIAGNOSTIC STUDIES: Oxygen Saturation is 98% on RA, nl by my interpretation.    COORDINATION OF CARE: 10:49 PM Discussed treatment plan with pt at bedside which includes UA and rapid strep screen and pt agreed to plan.    Labs Review Labs Reviewed  RAPID STREP SCREEN (NOT AT [facility]) - Abnormal; Notable for the following:    Streptococcus, Group A Screen (Direct) POSITIVE (*)    All other components within normal limits  URINALYSIS, ROUTINE W REFLEX MICROSCOPIC (NOT AT [facility]) - Abnormal; Notable for the following:    APPearance TURBID (*)    Hgb urine dipstick LARGE (*)    Leukocytes, UA LARGE (*)    All other components within normal limits  URINE MICROSCOPIC-ADD ON - Abnormal; Notable for the following:    Squamous Epithelial / LPF 6-30 (*)    Bacteria, UA MANY (*)    All other components within normal limits  POC URINE PREG, ED    Imaging Review No results found. I have personally reviewed and evaluated these lab results as part of my medical decision-making.   EKG Interpretation None      MDM   Final diagnoses:  Strep pharyngitis  UTI (lower urinary tract infection)    I personally performed the services described in this documentation, which was scribed in my presence. The recorded information has been reviewed and is accurate.   Earley Favor, NP 01/07/15 0010  Lorre Nick, MD 01/07/15 [phone]

## 2015-01-07 MED ORDER — PHENAZOPYRIDINE HCL 200 MG PO TABS: 200.0000 mg | ORAL_TABLET | Freq: Three times a day (TID) | ORAL | Status: AC

## 2015-01-07 MED ORDER — SULFAMETHOXAZOLE-TRIMETHOPRIM 800-160 MG PO TABS
1.0000 | ORAL_TABLET | Freq: Two times a day (BID) | ORAL | Status: AC
Start: 2015-01-07 — End: 2015-01-14

## 2015-01-07 NOTE — Discharge Instructions (Signed)
You have been given an injection of Penicillin for your strep throat and a prescription to treat your urinary tract infection Please take all the medication You have also been give a resource list to help you fined a primary care physician      Emergency Department Resource Guide 1) Find a Doctor and Pay Out of Pocket Although you won't have to find out who is covered by your insurance plan, it is a good idea to ask around and get recommendations. You will then need to call the office and see if the doctor you have chosen will accept you as a new patient and what types of options they offer for patients who are self-pay. Some doctors offer discounts or will set up payment plans for their patients who do not have insurance, but you will need to ask so you aren't surprised when you get to your appointment.  2) Contact Your Local Health Department Not all health departments have doctors that can see patients for sick visits, but many do, so it is worth a call to see if yours does. If you don't know where your local health department is, you can check in your phone book. The CDC also has a tool to help you locate your state's health department, and many state websites also have listings of all of their local health departments.  3) Find a Walk-in Clinic If your illness is not likely to be very severe or complicated, you may want to try a walk in clinic. These are popping up all over the country in pharmacies, drugstores, and shopping centers. They're usually staffed by nurse practitioners or physician assistants that have been trained to treat common illnesses and complaints. They're usually fairly quick and inexpensive. However, if you have serious medical issues or chronic medical problems, these are probably not your best option.  No Primary Care Doctor: - Call Health Connect at  [phone] - they can help you locate a primary care doctor that  accepts your insurance, provides certain services,  etc. - Physician Referral Service- [phone]  Chronic Pain Problems: Organization         Address  Phone   Notes  [facility]  [phone] Patients need to be referred by their primary care doctor.   Medication Assistance: Organization         Address  Phone   Notes  [facility] [address] [place], [place] [address] [phone] --Must be a resident of [place] -- Must have NO insurance coverage whatsoever (no Medicaid/ Medicare, etc.) -- The pt. MUST have a primary care doctor that directs their care regularly and follows them in the community   MedAssist  [phone]   [employer]  [phone]    Agencies that provide inexpensive medical care: Organization         Address  Phone   Notes  [facility]  [phone]   [facility]    [phone]   [facility] [address] [place], [place] [address] [phone]   [facility] [place] [address], [place] [phone]   Planned Parenthood    [phone]   [facility]    [phone]   [facility] and [facility]  [address], [place] Phone:  [phone], Fax:  [phone] Hours of Operation:  9 am - 6 pm, M-F.  Also accepts Medicaid/Medicare and self-pay.  [facility] for [place] [address] [place], [address], [place] Phone: [phone], Fax: [phone]. Hours of Operation:  8:30 am - 5:30 pm, M-F.  Also accepts Medicaid and self-pay.  [facility] [address] [place] Phone: [phone]   [place], [place], [place] [phone], Ext. [phone] Mondays & Thursdays: 7-9 AM.  First 15 patients are seen on a first come, first serve basis.    [place] Providers:  Organization         Address  Phone   Notes  [facility] [address],  [phone] Also accepts self-pay patients.  [facility] [address], [place]  [phone]   [place], [address], [place] [phone]   [facility] [address] [place] [phone]   Lucianne Lei [address], [place]   [phone] Only accepts [place] Access [place] patients after they have their name applied to their card.   Self-Pay (no insurance) in [facility]:  Organization         Address  Phone   Notes  Sickle Cell Patients, [facility] [place] [phone]   [facility] [place] [phone]   [facility] [place]  [place], [place], [place] [phone]   [facility]/Dr. Osei-Bonsu  [address], [place] or [address], [place] [phone] Phone number for both [place] and [place] locations is the same.  Urgent Medical and [facility] [address] [place] [phone]   [facility] [address] [place] or [address] [phone]   [facility] [address], [place] [phone], phone; [phone], fax Sees patients 1st and 3rd Saturday of every month.  Must not qualify for public or private insurance (i.e. Medicaid, Medicare, Becker Health Choice, Veterans' Benefits)  Household income should be no more than 200% of the poverty level The clinic cannot treat you if you are pregnant or think you are pregnant  Sexually transmitted diseases are not treated at the clinic.    Dental Care: Organization         Address  Phone  Notes  [facility] Department of [facility] [phone] Accepts children up to  age 51 who are enrolled in [place] or [place]; pregnant women with a Medicaid card; and children who have applied for Medicaid or [place] Health Choice, but were declined, whose parents can pay a reduced fee at time of service.  [facility] Department of [facility]  [address], [place] [phone] Accepts children up to age 24 who are enrolled in [place] or [place]; pregnant women with a Medicaid card; and children who have applied for Medicaid or [place] Health Choice, but were declined, whose parents can pay a reduced fee at time of service.  [place] Adult [facility] Access PROGRAM  [place] [phone] Patients are seen by appointment only. Walk-ins are not accepted. [place] will see patients 54 years of age and older. Monday - Tuesday (  8am-5pm) Most Wednesdays (8:30-5pm) $30 per visit, cash only  [facility] Adult Dental Access PROGRAM  [address], [facility] [phone] Patients are seen by appointment only. Walk-ins are not accepted. [place] will see patients 2 years of age and older. One Wednesday Evening (Monthly: Volunteer Based).  $30 per visit, cash only  [place]  [phone] for adults; Children under age 70, call Graduate Pediatric Dentistry at [phone]. Children aged 26-14, please call [phone] to request a pediatric application.  Dental services are provided in all areas of dental care including fillings, crowns and bridges, complete and partial dentures, implants, gum treatment, root canals, and extractions. Preventive care is also provided. Treatment is provided to both adults and children. Patients are selected via a lottery and there is often a waiting list.   [facility] [address], [place]  [phone] [email or web address]   [facility] [address] [place], [place] [phone], Ext. [phone] Second and Fourth Thursday of  each month, opens at 6:30 AM; Clinic ends at 9 AM.  Patients are seen on a first-come first-served basis, and a limited number are seen during each clinic.   [facility]  [address] Hillard Danker [place], [place] [phone]   Eligibility Requirements You must have lived in [place], [place], or [place] counties for at least the last three months.   You cannot be eligible for state or federal sponsored [employer], including [employer], [place], or [employer].   You generally cannot be eligible for healthcare insurance through your employer.    How to apply: Eligibility screenings are held every Tuesday and Wednesday afternoon from 1:00 pm until 4:00 pm. You do not need an appointment for the interview!  [facility] [address], [place], [place]   [place]  Grants Department  [place]  [phone]    Behavioral Health Resources in the Community: Intensive Outpatient Programs Organization         Address  Phone  Notes  [place]. [address] [place], [place] [phone]   [facility] Outpatient [address], [place], [place]   ADS: Alcohol & Drug Svcs [address] [place], [place]   [place] [address],  [place], [place] or [phone]   Substance Abuse Resources Organization         Address  Phone  Notes  Alcohol and Drug Services  [phone]   [place]  [phone]   The [place]   Chinita Pester  [phone]   Residential & Outpatient Substance Abuse Program  [phone]   Psychological Services Organization         Address  Phone  Notes  [facility] [place]  [place]  [phone]   [place] [address]  [place] or [phone]    Mobile Crisis Teams Organization         Address  Phone  Notes  [facility], Mobile Crisis Care Unit  [phone]   [facility]  [address] [place], [place]   [place] [address], [place] [phone]    Self-Help/Support Groups Organization         Address  Phone  Notes  [facility]ental Health Assoc[facility] of [place] - variety of support groups  [place]lynis [place]unsucker Call for more information  Narcotics Anonymous (NA), [facility] [address], [employer] [place]  2 meetings at this location   Special educational needs teacher         Address  Phone  Notes  [facility] Residential Treatment [place],    [place]  [phone]   [facility]  [address] [place] [address]60, [place], [place]   [place], [place] [phone] Admissions: 8am-3pm M-F  Incentives Substance [place] [address],    [place], [place] [phone]   [facility] [address] [place], [place], [place]   [facility] [address],  [place], [place]   [facility] - Intensive Outpatient [address], [place], [place]   [facility] ([place] [place] Community.,  [place], [place] [phone] or [phone]   [facility] Services (RTS) [address], [place], [place] Accepts Medicaid  [facility] [address],  [place] [phone] Substance Abuse/Addiction Treatment   [facility] Organization         Address  Phone  Notes  [facility]  [phone]   Domenic Schwab, PhD [address] Arlis Porta [place], [place]   [phone] or [phone]   [place], [place] [phone]     [facility] [address], [place], [place] [phone] Insurance/Medicaid/sponsorship through [facility] and Families [address], [address] [place]                                    [place], [place] [phone] [place] [address], [place] [phone]    Dr. Adele Schilder  [phone]   [facility] [place] Dept. 1) [address] [place] 2) [place] 3)  [place] [address], [place] [phone]  [phone]   [place] [phone] or [phone] (After Hours)
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
